# Patient Record
Sex: Female | Born: 1955 | Race: White | Hispanic: No | Smoking: Current every day smoker
Health system: Southern US, Community
[De-identification: ages and names within clinical notes are randomized; demographics above are authoritative.]

## PROBLEM LIST (undated history)

## (undated) DIAGNOSIS — F419 Anxiety disorder, unspecified: Secondary | ICD-10-CM

## (undated) DIAGNOSIS — Z86010 Personal history of colonic polyps: Secondary | ICD-10-CM

## (undated) HISTORY — DX: Personal history of colonic polyps: Z86.010

## (undated) HISTORY — PX: POLYPECTOMY: SHX149

## (undated) HISTORY — DX: Anxiety disorder, unspecified: F41.9

## (undated) HISTORY — PX: COLONOSCOPY: SHX174

---

## 2011-11-02 ENCOUNTER — Other Ambulatory Visit (HOSPITAL_COMMUNITY)
Admission: RE | Admit: 2011-11-02 | Discharge: 2011-11-02 | Disposition: A | Discharge status: Home / Self Care | Payer: BC Managed Care – PPO | Source: Ambulatory Visit | Attending: Family Medicine | Admitting: Family Medicine

## 2011-11-02 ENCOUNTER — Ambulatory Visit (INDEPENDENT_AMBULATORY_CARE_PROVIDER_SITE_OTHER): Payer: BC Managed Care – PPO | Admitting: Family Medicine

## 2011-11-02 ENCOUNTER — Encounter: Payer: Self-pay | Admitting: Family Medicine

## 2011-11-02 VITALS — BP 164/100 | HR 116 | Temp 98.3°F | Ht 65.75 in | Wt 198.0 lb

## 2011-11-02 DIAGNOSIS — Z1151 Encounter for screening for human papillomavirus (HPV): Secondary | ICD-10-CM | POA: Insufficient documentation

## 2011-11-02 DIAGNOSIS — Z1231 Encounter for screening mammogram for malignant neoplasm of breast: Secondary | ICD-10-CM

## 2011-11-02 DIAGNOSIS — Z1211 Encounter for screening for malignant neoplasm of colon: Secondary | ICD-10-CM

## 2011-11-02 DIAGNOSIS — Z01419 Encounter for gynecological examination (general) (routine) without abnormal findings: Secondary | ICD-10-CM | POA: Insufficient documentation

## 2011-11-02 DIAGNOSIS — Z0000 Encounter for general adult medical examination without abnormal findings: Secondary | ICD-10-CM

## 2011-11-02 DIAGNOSIS — F4322 Adjustment disorder with anxiety: Secondary | ICD-10-CM

## 2011-11-02 DIAGNOSIS — Z136 Encounter for screening for cardiovascular disorders: Secondary | ICD-10-CM

## 2011-11-02 DIAGNOSIS — R030 Elevated blood-pressure reading, without diagnosis of hypertension: Secondary | ICD-10-CM

## 2011-11-02 LAB — COMPREHENSIVE METABOLIC PANEL
ALT: 27 U/L (ref 0–35)
AST: 23 U/L (ref 0–37)
Alkaline Phosphatase: 95 U/L (ref 39–117)
BUN: 12 mg/dL (ref 6–23)
Calcium: 9.9 mg/dL (ref 8.4–10.5)
Chloride: 106 mEq/L (ref 96–112)
Creatinine, Ser: 0.8 mg/dL (ref 0.4–1.2)

## 2011-11-02 LAB — CBC WITH DIFFERENTIAL/PLATELET
Basophils Relative: 0.7 % (ref 0.0–3.0)
Eosinophils Relative: 0.5 % (ref 0.0–5.0)
Lymphocytes Relative: 15.1 % (ref 12.0–46.0)
Monocytes Absolute: 1.1 10*3/uL — ABNORMAL HIGH (ref 0.1–1.0)
Monocytes Relative: 6.5 % (ref 3.0–12.0)
Neutrophils Relative %: 77.2 % — ABNORMAL HIGH (ref 43.0–77.0)
Platelets: 322 10*3/uL (ref 150.0–400.0)
RBC: 4.76 Mil/uL (ref 3.87–5.11)
WBC: 16.3 10*3/uL — ABNORMAL HIGH (ref 4.5–10.5)

## 2011-11-02 LAB — LIPID PANEL
HDL: 68.7 mg/dL (ref >39.00)
Total CHOL/HDL Ratio: 3
Triglycerides: 196 mg/dL — ABNORMAL HIGH (ref 0.0–149.0)
VLDL: 39.2 mg/dL (ref 0.0–40.0)

## 2011-11-02 LAB — LDL CHOLESTEROL, DIRECT: Direct LDL: 136.4 mg/dL

## 2011-11-02 MED ORDER — ALPRAZOLAM 0.25 MG PO TABS: 0.2500 mg | ORAL_TABLET | Freq: Three times a day (TID) | ORAL | Status: AC | PRN

## 2011-11-02 MED ORDER — BUSPIRONE HCL 15 MG PO TABS: 7.5000 mg | ORAL_TABLET | Freq: Two times a day (BID) | ORAL | Status: DC

## 2011-11-02 NOTE — Patient Instructions (Addendum)
It was nice to meet you.  Please go to any pharmacy or [facility] and buy and OMRON blood pressure cuff. Please make sure it's not a wrist cuff. I want you to check it twice a day, write it down and call me after 1 week of readings.  Please stop by to see Shirlee Limerick on your way out to set up your mammogram and GI appointment (for colonoscopy).  Please come see me in one month.

## 2011-11-02 NOTE — Progress Notes (Signed)
Subjective:    Patient ID: Maureen Warren, female    DOB: 07-Oct-1955, 56 y.o.   MRN: [id number]  HPI  56 yo G2P2 here to establish care.  Has not been to a doctor in years.  Has not had a mammogram or colonoscopy that she can ever remember.  No h/o abnormal pap smears but it has been years.  Her sister who is 106 yo was just diagnosed with breast CA.  She has another sister that was diagnosed with breast CA in her 28s. She is unsure if either one was tested for BRCA or estrogen sensitivities.  Sister is now living with her and she is constantly anxious.  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  She is often tearful. Ms. Geeslin feels like she is constantly caring for others and has not had time for herself. No SI or HI. Does not feel depressed.  Elevated BP- she is very anxious about coming to the doctor. She states it is always elevated at doctor's offices. When she checked it at [facility] last week, was 132/80.  She denies any HA, blurred vision, CP or SOB.  She is a smoker. No known family h/o HTN.  Patient Active Problem List  Diagnosis  . Routine general medical examination at a health care facility  . Routine gynecological examination  . Elevated blood pressure (not hypertension)   Past Medical History  Diagnosis Date  . Anxiety    No past surgical history on file. History  Substance Use Topics  . Smoking status: Current Everyday Smoker  . Smokeless tobacco: Not on file  . Alcohol Use: Not on file   Family History  Problem Relation Age of Onset  . Cancer Sister 3    breast cancer   No Known Allergies No current outpatient prescriptions on file prior to visit.   The PMH, PSH, Social History, Family History, Medications, and allergies have been reviewed in [facility], and have been updated if relevant.   Review of Systems See HPI Patient reports no  vision/ hearing changes,anorexia, weight change, fever ,adenopathy, persistant / recurrent  hoarseness, swallowing issues, chest pain, edema,persistant / recurrent cough, hemoptysis, dyspnea(rest, exertional, paroxysmal nocturnal), gastrointestinal  bleeding (melena, rectal bleeding), abdominal pain, excessive heart burn, GU symptoms(dysuria, hematuria, pyuria, voiding/incontinence  Issues) syncope, focal weakness, severe memory loss, concerning skin lesions, depression, abnormal bruising/bleeding, major joint swelling, breast masses or abnormal vaginal bleeding.  =    Objective:   Physical Exam BP 164/100  Pulse 116  Temp 98.3 F (36.8 C)  Ht 5' 5.75" (1.67 m)  Wt 198 lb (89.812 kg)  BMI 32.20 kg/m2  General:  Obese,well-nourished,in no acute distress; alert,appropriate and cooperative throughout examination Head:  normocephalic and atraumatic.   Eyes:  vision grossly intact, pupils equal, pupils round, and pupils reactive to light.   Ears:  R ear normal and L ear normal.   Nose:  no external deformity.   Mouth:  good dentition.   Neck:  No deformities, masses, or tenderness noted. Breasts:  No mass, nodules, thickening, tenderness, bulging, retraction, inflamation, nipple discharge or skin changes noted.   Lungs:  Normal respiratory effort, chest expands symmetrically. Lungs are clear to auscultation, no crackles or wheezes. Heart:  Normal rate and regular rhythm. S1 and S2 normal without gallop, murmur, click, rub or other extra sounds. Abdomen:  Bowel sounds positive,abdomen soft and non-tender without masses, organomegaly or hernias noted. Rectal:  no external abnormalities.   Genitalia:  Pelvic  Exam:        External: normal female genitalia without lesions or masses        Vagina: normal without lesions or masses        Cervix: normal without lesions or masses        Adnexa: normal bimanual exam without masses or fullness        Uterus: normal by palpation        Pap smear: performed Msk:  No deformity or scoliosis noted of thoracic or lumbar spine.   Extremities:  No  clubbing, cyanosis, edema, or deformity noted with normal full range of motion of all joints.   Neurologic:  alert & oriented X3 and gait normal.   Skin:  Intact without suspicious lesions or rashes Cervical Nodes:  No lymphadenopathy noted Axillary Nodes:  No palpable lymphadenopathy Psych:  Cognition and judgment appear intact. Alert and cooperative with normal attention span and concentration. No apparent delusions, illusions, hallucinations     Assessment & Plan:   1. Routine general medical examination at a health care facility  Reviewed preventive care protocols, scheduled due services, and updated immunizations Discussed nutrition, exercise, diet, and healthy lifestyle.  Comprehensive metabolic panel, CBC with Differential, Cytology - PAP Ambulatory referral to Gastroenterology MM Digital Screening  2. Routine gynecological examination  Cytology - PAP  3. Screening for ischemic heart disease  Lipid Panel  4. Elevated blood pressure (not hypertension)  BP elevated today but she is quite anxious. Discussed options- I would prefer not to treat her if this is simply white coat HTN as we could cause severe hypotension.  She is asymptomatic.  She agreed to buy a home BP cuff and check readings at home (see pt instructions).  She will also follow up with me in 1 month.    5. Adjustment disorder with anxiety     Deteriorated.  Discussed addiction and sedation potential of xanax.  She is deferring psychotherapy at this time.  Start buspar 7.5 mg twice daily and as needed xanax until buspar has taken affect.  Follow up in one month. The patient indicates understanding of these issues and agrees with the plan.

## 2011-11-03 ENCOUNTER — Other Ambulatory Visit: Payer: Self-pay | Admitting: Family Medicine

## 2011-11-03 DIAGNOSIS — D72829 Elevated white blood cell count, unspecified: Secondary | ICD-10-CM

## 2011-11-06 ENCOUNTER — Other Ambulatory Visit: Payer: BC Managed Care – PPO

## 2011-11-08 ENCOUNTER — Other Ambulatory Visit (INDEPENDENT_AMBULATORY_CARE_PROVIDER_SITE_OTHER): Payer: BC Managed Care – PPO

## 2011-11-08 DIAGNOSIS — D72829 Elevated white blood cell count, unspecified: Secondary | ICD-10-CM

## 2011-11-08 LAB — CBC WITH DIFFERENTIAL/PLATELET
Eosinophils Relative: 1.2 % (ref 0.0–5.0)
HCT: 44.8 % (ref 36.0–46.0)
Hemoglobin: 15.4 g/dL — ABNORMAL HIGH (ref 12.0–15.0)
Lymphs Abs: 3.1 10*3/uL (ref 0.7–4.0)
Monocytes Relative: 4.8 % (ref 3.0–12.0)
Platelets: 371 10*3/uL (ref 150.0–400.0)
RBC: 4.6 Mil/uL (ref 3.87–5.11)
WBC: 13.7 10*3/uL — ABNORMAL HIGH (ref 4.5–10.5)

## 2011-11-09 ENCOUNTER — Encounter: Payer: Self-pay | Admitting: *Deleted

## 2011-11-09 LAB — PATHOLOGIST SMEAR REVIEW

## 2011-11-13 ENCOUNTER — Ambulatory Visit
Admission: RE | Admit: 2011-11-13 | Discharge: 2011-11-13 | Disposition: A | Discharge status: Home / Self Care | Payer: BC Managed Care – PPO | Source: Ambulatory Visit | Attending: Family Medicine | Admitting: Family Medicine

## 2011-11-13 DIAGNOSIS — Z1231 Encounter for screening mammogram for malignant neoplasm of breast: Secondary | ICD-10-CM

## 2011-11-15 ENCOUNTER — Other Ambulatory Visit: Payer: Self-pay | Admitting: Family Medicine

## 2011-11-15 DIAGNOSIS — R928 Other abnormal and inconclusive findings on diagnostic imaging of breast: Secondary | ICD-10-CM

## 2011-11-21 ENCOUNTER — Ambulatory Visit
Admission: RE | Admit: 2011-11-21 | Discharge: 2011-11-21 | Disposition: A | Discharge status: Home / Self Care | Payer: BC Managed Care – PPO | Source: Ambulatory Visit | Attending: Family Medicine | Admitting: Family Medicine

## 2011-11-21 DIAGNOSIS — R928 Other abnormal and inconclusive findings on diagnostic imaging of breast: Secondary | ICD-10-CM

## 2011-12-06 ENCOUNTER — Other Ambulatory Visit: Payer: Self-pay | Admitting: Family Medicine

## 2011-12-06 DIAGNOSIS — R928 Other abnormal and inconclusive findings on diagnostic imaging of breast: Secondary | ICD-10-CM

## 2011-12-07 ENCOUNTER — Ambulatory Visit: Payer: BC Managed Care – PPO | Admitting: Family Medicine

## 2011-12-13 ENCOUNTER — Ambulatory Visit (AMBULATORY_SURGERY_CENTER): Payer: BC Managed Care – PPO | Admitting: *Deleted

## 2011-12-13 ENCOUNTER — Encounter: Payer: Self-pay | Admitting: Internal Medicine

## 2011-12-13 VITALS — Ht 65.5 in | Wt 199.2 lb

## 2011-12-13 DIAGNOSIS — Z1211 Encounter for screening for malignant neoplasm of colon: Secondary | ICD-10-CM

## 2011-12-13 MED ORDER — NA SULFATE-K SULFATE-MG SULF 17.5-3.13-1.6 GM/177ML PO SOLN: ORAL | Status: DC

## 2011-12-20 ENCOUNTER — Encounter: Payer: Self-pay | Admitting: Family Medicine

## 2011-12-20 ENCOUNTER — Other Ambulatory Visit: Payer: Self-pay | Admitting: Family Medicine

## 2011-12-20 ENCOUNTER — Ambulatory Visit (INDEPENDENT_AMBULATORY_CARE_PROVIDER_SITE_OTHER): Payer: BC Managed Care – PPO | Admitting: Family Medicine

## 2011-12-20 VITALS — BP 138/84 | HR 88 | Temp 97.8°F | Wt 199.0 lb

## 2011-12-20 DIAGNOSIS — F4322 Adjustment disorder with anxiety: Secondary | ICD-10-CM

## 2011-12-20 DIAGNOSIS — R030 Elevated blood-pressure reading, without diagnosis of hypertension: Secondary | ICD-10-CM

## 2011-12-20 DIAGNOSIS — Z23 Encounter for immunization: Secondary | ICD-10-CM

## 2011-12-20 DIAGNOSIS — D72819 Decreased white blood cell count, unspecified: Secondary | ICD-10-CM

## 2011-12-20 DIAGNOSIS — D72829 Elevated white blood cell count, unspecified: Secondary | ICD-10-CM

## 2011-12-20 LAB — CBC WITH DIFFERENTIAL/PLATELET
Basophils Absolute: 0.2 10*3/uL — ABNORMAL HIGH (ref 0.0–0.1)
Basophils Relative: 1.3 % (ref 0.0–3.0)
Eosinophils Absolute: 0.1 10*3/uL (ref 0.0–0.7)
HCT: 46.3 % — ABNORMAL HIGH (ref 36.0–46.0)
Hemoglobin: 15.6 g/dL — ABNORMAL HIGH (ref 12.0–15.0)
Lymphocytes Relative: 16.6 % (ref 12.0–46.0)
Lymphs Abs: 2.5 10*3/uL (ref 0.7–4.0)
MCHC: 33.7 g/dL (ref 30.0–36.0)
MCV: 98.2 fl (ref 78.0–100.0)
Monocytes Absolute: 0.7 10*3/uL (ref 0.1–1.0)
Neutro Abs: 11.8 10*3/uL — ABNORMAL HIGH (ref 1.4–7.7)
RBC: 4.72 Mil/uL (ref 3.87–5.11)
RDW: 13.7 % (ref 11.5–14.6)

## 2011-12-20 MED ORDER — BUSPIRONE HCL 15 MG PO TABS: 15.0000 mg | ORAL_TABLET | Freq: Two times a day (BID) | ORAL | Status: DC

## 2011-12-20 NOTE — Progress Notes (Signed)
Subjective:    Patient ID: Maureen Warren, female    DOB: 1955-08-14, 56 y.o.   MRN: [id number]  HPI  56 yo G2P2 here for one month follow up.  Establish care last month.  Adjustment disorder-  Sister is now living with her and she is constantly anxious.  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  She is often tearful. Ms. Gritton feels like she is constantly caring for others and has not had time for herself. No SI or HI. Does not feel depressed. We started Buspar 7.5 mg twice daily last month. She has not noticed much improvement yet.  No side effects. Does use prn xanax which has helped.  Elevated BP-  Bp was elevated at last office visit.  She was nervous about her first appointment. Normotensive today.  She denies any HA, blurred vision, CP or SOB.  She is a smoker. No known family h/o HTN.  Patient Active Problem List  Diagnosis  . Routine general medical examination at a health care facility  . Routine gynecological examination  . Elevated blood pressure (not hypertension)  . Adjustment disorder with anxiety  . Abnormal mammogram   Past Medical History  Diagnosis Date  . Anxiety    Past Surgical History  Procedure Date  . Cesarean section 1989   History  Substance Use Topics  . Smoking status: Current Every Day Smoker -- 1.0 packs/day    Types: Cigarettes  . Smokeless tobacco: Never Used  . Alcohol Use: 3.6 oz/week    6 Cans of beer per week   Family History  Problem Relation Age of Onset  . Cancer Sister 28    breast cancer  . Colon cancer Neg Hx   . Stomach cancer Neg Hx    No Known Allergies Current Outpatient Prescriptions on File Prior to Visit  Medication Sig Dispense Refill  . ALPRAZolam (XANAX) 0.25 MG tablet Take 0.25 mg by mouth 3 (three) times daily as needed.      . busPIRone (BUSPAR) 15 MG tablet Take 0.5 tablets (7.5 mg total) by mouth 2 (two) times daily.  60 tablet  1  . Na Sulfate-K Sulfate-Mg Sulf (SUPREP  BOWEL PREP) SOLN suprep as directed.  No substitutions  354 mL  0   The PMH, PSH, Social History, Family History, Medications, and allergies have been reviewed in [facility], and have been updated if relevant.   Review of Systems See HPI Patient reports no  vision/ hearing changes,anorexia, weight change, fever ,adenopathy, persistant / recurrent hoarseness, swallowing issues, chest pain, edema,persistant / recurrent cough, hemoptysis, dyspnea(rest, exertional, paroxysmal nocturnal), gastrointestinal  bleeding (melena, rectal bleeding), abdominal pain, excessive heart burn, GU symptoms(dysuria, hematuria, pyuria, voiding/incontinence  Issues) syncope, focal weakness, severe memory loss, concerning skin lesions, depression, abnormal bruising/bleeding, major joint swelling, breast masses or abnormal vaginal bleeding.  =    Objective:   Physical Exam BP 138/84  Pulse 88  Temp 97.8 F (36.6 C)  Wt 199 lb (90.266 kg)  General:  Obese,well-nourished,in no acute distress; alert,appropriate and cooperative throughout examination Head:  normocephalic and atraumatic.   Eyes:  vision grossly intact, pupils equal, pupils round, and pupils reactive to light.   Ears:  R ear normal and L ear normal.   Nose:  no external deformity.   Lungs:  Normal respiratory effort, chest expands symmetrically. Lungs are clear to auscultation, no crackles or wheezes. Heart:  Normal rate and regular rhythm. S1 and S2 normal without gallop, murmur,  click, rub or other extra sounds. Abdomen:  Bowel sounds positive,abdomen soft and non-tender without masses, organomegaly or hernias noted. Msk:  No deformity or scoliosis noted of thoracic or lumbar spine.   Extremities:  No clubbing, cyanosis, edema, or deformity noted with normal full range of motion of all joints.   Neurologic:  alert & oriented X3 and gait normal.   Skin:  Intact without suspicious lesions or rashes Psych:  Cognition and judgment appear intact. Alert and  cooperative with normal attention span and concentration. No apparent delusions, illusions, hallucinations     Assessment & Plan:    1. Elevated blood pressure (not hypertension)  Normotensive today. Continue to monitor on home BP cuff. Reassurance provided. Call or return to clinic prn if these symptoms worsen or fail to improve as anticipated.    2. Adjustment disorder with anxiety  Increase Buspar to 15 mg twice daily. Pt to call in one month with an update.

## 2011-12-20 NOTE — Patient Instructions (Addendum)
I am so pleased your blood pressure! We are increasing your buspar to 15 mg (whole tablet) twice daily.  Call me in one month and let me know how you're doing.  I will be thinking about you next Wednesday.

## 2011-12-21 ENCOUNTER — Telehealth: Payer: Self-pay | Admitting: Oncology

## 2011-12-21 NOTE — Telephone Encounter (Signed)
S/W pt in re NP appt 10/01 @ 10:30 w/ Dr. Clelia Croft.  Referring Dr. Jovita Gamma Dx-Leukocytosis NP packet mailed.

## 2011-12-22 ENCOUNTER — Telehealth: Payer: Self-pay | Admitting: Oncology

## 2011-12-22 NOTE — Telephone Encounter (Signed)
C/D on 9/20 for appt 10/01

## 2011-12-27 ENCOUNTER — Encounter: Payer: Self-pay | Admitting: Internal Medicine

## 2011-12-27 ENCOUNTER — Ambulatory Visit (AMBULATORY_SURGERY_CENTER): Payer: BC Managed Care – PPO | Admitting: Internal Medicine

## 2011-12-27 VITALS — BP 130/90 | HR 105 | Temp 99.1°F | Resp 16 | Ht 65.0 in | Wt 199.0 lb

## 2011-12-27 DIAGNOSIS — D126 Benign neoplasm of colon, unspecified: Secondary | ICD-10-CM

## 2011-12-27 DIAGNOSIS — Z1211 Encounter for screening for malignant neoplasm of colon: Secondary | ICD-10-CM

## 2011-12-27 DIAGNOSIS — K648 Other hemorrhoids: Secondary | ICD-10-CM

## 2011-12-27 DIAGNOSIS — K5730 Diverticulosis of large intestine without perforation or abscess without bleeding: Secondary | ICD-10-CM

## 2011-12-27 MED ORDER — SODIUM CHLORIDE 0.9 % IV SOLN: 500.0000 mL | INTRAVENOUS | Status: DC

## 2011-12-27 NOTE — Op Note (Signed)
[facility] [address] [place], [address]   COLONOSCOPY PROCEDURE REPORT  PATIENT: Manasi, Dishon  MR#: [id number] BIRTHDATE: [birth year] , 56  yrs. old GENDER: Female ENDOSCOPIST: Iva Boop, MD, [facility] REFERRED XB:JYNWG Aron, M.D. PROCEDURE DATE:  12/27/2011 PROCEDURE:   Colonoscopy with snare polypectomy ASA CLASS:   Class II INDICATIONS:average risk screening. MEDICATIONS: Propofol (Diprivan) 480 mg IV, MAC sedation, administered by CRNA, and These medications were titrated to patient response per physician's verbal order  DESCRIPTION OF PROCEDURE:   After the risks benefits and alternatives of the procedure were thoroughly explained, informed consent was obtained.  A digital rectal exam revealed no abnormalities of the rectum.   The LB [id number]  endoscope was introduced through the anus and advanced to the cecum, which was identified by both the appendix and ileocecal valve. No adverse events experienced.   The quality of the prep was Suprep excellent The instrument was then slowly withdrawn as the colon was fully examined.      COLON FINDINGS: Multiple sessile polyps (12) measuring 5-10 mm in size were found at the cecum, in the ascending colon, transverse colon, at the splenic flexure, in the descending colon, sigmoid colon, and rectum.  A polypectomy was performed with a cold snare. The resection was complete and the polyp tissue was completely retrieved.   There was severe diverticulosis noted in the sigmoid colon with associated angulation and mild luminal narrowing. The sigmoid was somewhat fixed also.  Small internal hemorrhoids were found.   The colon mucosa was otherwise normal.  Retroflexed views revealed internal hemorrhoids. The time to cecum=7 minutes 40 seconds.  Withdrawal time=22 minutes 45 seconds.  The scope was withdrawn and the procedure completed. COMPLICATIONS: There were no complications.  ENDOSCOPIC  IMPRESSION: 1.   Multiple sessile polyps (12) measuring 5-10 mm in size were found at the cecum, in the ascending colon, transverse colon, at the splenic flexure, in the descending colon, sigmoid colon, and rectum; polypectomy was performed with a cold snare 2.   There was severe diverticulosis noted in the sigmoid colon with mild luminal narrowing and it was also somewhat fixed, making scope passage moderately difficult beyond here. 3.   Small internal hemorrhoids 4.   The colon mucosa was otherwise normal with excellent prep  RECOMMENDATIONS: Timing of repeat colonoscopy will be determined by pathology findings.   eSigned:  Iva Boop, MD, [facility] 12/27/2011 9:57 AM   cc: Enos Fling MD and The Patient   PATIENT NAME:  Amulya, Quintin MR#: [id number]

## 2011-12-27 NOTE — Patient Instructions (Addendum)
Twelve (12) polyps were removed today. They appear benign. i will let you know the pathology results and recommendations.  You also have diverticulosis and hemorrhoids.  Thank you for choosing me and  [facility].  Iva Boop, MD, FACG  YOU HAD AN ENDOSCOPIC PROCEDURE TODAY AT THE [facility]: Refer to the procedure report that was given to you for any specific questions about what was found during the examination.  If the procedure report does not answer your questions, please call your gastroenterologist to clarify.  If you requested that your care partner not be given the details of your procedure findings, then the procedure report has been included in a sealed envelope for you to review at your convenience later.  YOU SHOULD EXPECT: Some feelings of bloating in the abdomen. Passage of more gas than usual.  Walking can help get rid of the air that was put into your GI tract during the procedure and reduce the bloating. If you had a lower endoscopy (such as a colonoscopy or flexible sigmoidoscopy) you may notice spotting of blood in your stool or on the toilet paper. If you underwent a bowel prep for your procedure, then you may not have a normal bowel movement for a few days.  DIET: Your first meal following the procedure should be a light meal and then it is ok to progress to your normal diet.  A half-sandwich or bowl of soup is an example of a good first meal.  Heavy or fried foods are harder to digest and may make you feel nauseous or bloated.  Likewise meals heavy in dairy and vegetables can cause extra gas to form and this can also increase the bloating.  Drink plenty of fluids but you should avoid alcoholic beverages for 24 hours.  ACTIVITY: Your care partner should take you home directly after the procedure.  You should plan to take it easy, moving slowly for the rest of the day.  You can resume normal activity the day after the procedure however you should NOT  DRIVE or use heavy machinery for 24 hours (because of the sedation medicines used during the test).    SYMPTOMS TO REPORT IMMEDIATELY: A gastroenterologist can be reached at any hour.  During normal business hours, 8:30 AM to 5:00 PM Monday through Friday, call [phone].  After hours and on weekends, please call the GI answering service at [phone] who will take a message and have the physician on call contact you.   Following lower endoscopy (colonoscopy or flexible sigmoidoscopy):  Excessive amounts of blood in the stool  Significant tenderness or worsening of abdominal pains  Swelling of the abdomen that is new, acute  Fever of 100F or higher  FOLLOW UP: If any biopsies were taken you will be contacted by phone or by letter within the next 1-3 weeks.  Call your gastroenterologist if you have not heard about the biopsies in 3 weeks.  Our staff will call the home number listed on your records the next business day following your procedure to check on you and address any questions or concerns that you may have at that time regarding the information given to you following your procedure. This is a courtesy call and so if there is no answer at the home number and we have not heard from you through the emergency physician on call, we will assume that you have returned to your regular daily activities without incident.  SIGNATURES/CONFIDENTIALITY: You and/or your care partner  have signed paperwork which will be entered into your electronic medical record.  These signatures attest to the fact that that the information above on your After Visit Summary has been reviewed and is understood.  Full responsibility of the confidentiality of this discharge information lies with you and/or your care-partner.

## 2011-12-27 NOTE — Progress Notes (Signed)
Patient did not experience any of the following events: a burn prior to discharge; a fall within the facility; wrong site/side/patient/procedure/implant event; or a hospital transfer or hospital admission upon discharge from the facility. (G8907) Patient did not have preoperative order for IV antibiotic SSI prophylaxis. (G8918)  

## 2011-12-28 ENCOUNTER — Other Ambulatory Visit: Payer: Self-pay | Admitting: Oncology

## 2011-12-28 ENCOUNTER — Telehealth: Payer: Self-pay | Admitting: *Deleted

## 2011-12-28 DIAGNOSIS — D72829 Elevated white blood cell count, unspecified: Secondary | ICD-10-CM

## 2011-12-28 NOTE — Telephone Encounter (Signed)
No answer, left message to call if questions or concerns. 

## 2012-01-02 ENCOUNTER — Telehealth: Payer: Self-pay | Admitting: Oncology

## 2012-01-02 ENCOUNTER — Encounter: Payer: Self-pay | Admitting: Internal Medicine

## 2012-01-02 ENCOUNTER — Encounter: Payer: Self-pay | Admitting: Oncology

## 2012-01-02 ENCOUNTER — Ambulatory Visit (HOSPITAL_COMMUNITY)
Admission: RE | Admit: 2012-01-02 | Discharge: 2012-01-02 | Disposition: A | Discharge status: Home / Self Care | Payer: BC Managed Care – PPO | Source: Ambulatory Visit | Attending: Oncology | Admitting: Oncology

## 2012-01-02 ENCOUNTER — Ambulatory Visit: Payer: BC Managed Care – PPO

## 2012-01-02 ENCOUNTER — Ambulatory Visit (HOSPITAL_BASED_OUTPATIENT_CLINIC_OR_DEPARTMENT_OTHER): Payer: BC Managed Care – PPO | Admitting: Oncology

## 2012-01-02 ENCOUNTER — Other Ambulatory Visit (HOSPITAL_BASED_OUTPATIENT_CLINIC_OR_DEPARTMENT_OTHER): Payer: BC Managed Care – PPO | Admitting: Lab

## 2012-01-02 VITALS — BP 159/85 | HR 111 | Temp 98.6°F | Resp 20 | Ht 65.0 in | Wt 197.4 lb

## 2012-01-02 DIAGNOSIS — Z860100 Personal history of colon polyps, unspecified: Secondary | ICD-10-CM

## 2012-01-02 DIAGNOSIS — D72829 Elevated white blood cell count, unspecified: Secondary | ICD-10-CM

## 2012-01-02 DIAGNOSIS — F17200 Nicotine dependence, unspecified, uncomplicated: Secondary | ICD-10-CM

## 2012-01-02 DIAGNOSIS — F411 Generalized anxiety disorder: Secondary | ICD-10-CM

## 2012-01-02 DIAGNOSIS — R059 Cough, unspecified: Secondary | ICD-10-CM | POA: Insufficient documentation

## 2012-01-02 DIAGNOSIS — R05 Cough: Secondary | ICD-10-CM | POA: Insufficient documentation

## 2012-01-02 HISTORY — DX: Personal history of colon polyps, unspecified: Z86.0100

## 2012-01-02 HISTORY — DX: Personal history of colonic polyps: Z86.010

## 2012-01-02 LAB — CBC WITH DIFFERENTIAL/PLATELET
BASO%: 0.6 % (ref 0.0–2.0)
Eosinophils Absolute: 0.1 10*3/uL (ref 0.0–0.5)
HCT: 46.9 % — ABNORMAL HIGH (ref 34.8–46.6)
LYMPH%: 20.7 % (ref 14.0–49.7)
MCHC: 34.2 g/dL (ref 31.5–36.0)
MCV: 98.2 fL (ref 79.5–101.0)
MONO#: 1 10*3/uL — ABNORMAL HIGH (ref 0.1–0.9)
MONO%: 6.2 % (ref 0.0–14.0)
NEUT%: 71.9 % (ref 38.4–76.8)
Platelets: 343 10*3/uL (ref 145–400)
RBC: 4.77 10*6/uL (ref 3.70–5.45)
WBC: 15.5 10*3/uL — ABNORMAL HIGH (ref 3.9–10.3)

## 2012-01-02 LAB — COMPREHENSIVE METABOLIC PANEL (CC13)
Alkaline Phosphatase: 101 U/L (ref 40–150)
CO2: 22 mEq/L (ref 22–29)
Creatinine: 0.7 mg/dL (ref 0.6–1.1)
Glucose: 101 mg/dl — ABNORMAL HIGH (ref 70–99)
Sodium: 144 mEq/L (ref 136–145)
Total Bilirubin: 0.5 mg/dL (ref 0.20–1.20)

## 2012-01-02 LAB — CHCC SMEAR

## 2012-01-02 NOTE — Progress Notes (Signed)
CC:   Ruthe Mannan, M.D.  REASON FOR CONSULTATION:  Leukocytosis.  HISTORY OF PRESENT ILLNESS:  Ms. Ramella is a pleasant 56 year old woman, currently of [place], [place].  She is generally in relative good health.  However, she has not been seeking medical attention for the longest time.  Very anxious person, heavy smoker, who started establishing care with Dr. Dayton Martes for management of hypertension. However, she had ambulatory blood pressure monitoring that really did not require any treatment.  It was probably related to her anxiety.  The patient has been doing the age-appropriate routine cancer screening, including colonoscopy and a mammogram which were both unremarkable. 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  She is prescribed antianxiety medication and has been followed as mentioned by Dr. Dayton Martes for that.  Her more recent __________ on 09/18 she had a white cell count of 15.3, her hemoglobin was 15.6, platelet count 348.  She had normal differential at that time.  Historically she had a high white cell count on August 1st that was 16,000, dropped down to 13,000 on August 7th.  She did not report any recent infections, recent hospitalization, bronchitis, pneumonias, but as mentioned, she is a heavy smoker, as well as she has increased anxiety.  REVIEW OF SYSTEMS:  She did not report any headaches, blurry vision, double vision.  Did not report any motor or sensory neuropathy.  Did not report any alteration in mental status.  Did not report any psychiatric issues or depression.  Did not report any fever, chills, sweats.  Did not report any cough, hemoptysis, hematemesis.  No nausea.  Did not report any vomiting.  No abdominal pain.  Did not report any abdominal discomfort.  No diarrhea or constipation.  Did not report any musculoskeletal complaints.  Did not report any arthralgias or  myalgias. Rest of review of systems is unremarkable.  PAST MEDICAL HISTORY:  Significant for anxiety and history of as mentioned hypertension, does not require treatment.  She does not have any history of diabetes or heart disease.  She is status post cesarean section.  MEDICATIONS:  She is on Xanax, as well as BuSpar.  ALLERGIES:  None.  SOCIAL HISTORY:  She is married.  She has 2 children.  She smoked a pack per day for many years and sometimes smoked more than that when she is more anxious.  Drinks about 1 beer a day.  FAMILY HISTORY:  Her father died of car accident.  Mother had heart disease.  She had 1 sister who had breast cancer.  PHYSICAL EXAMINATION:  General:  Alert, awake, very pleasant, but anxious woman, appeared in no active distress.  Vital Signs:  Her blood pressure is 159/85, pulse is 111, respirations 20, temperature is 98. ECOG performance status is 0.  HEENT:  Head is normocephalic, atraumatic.  Pupils equal, round, reactive to light.  Oral mucosa moist and pink.  Neck:  Supple.  No lymphadenopathy.  Heart:  Regular rate and rhythm, S1 and S2.  Lungs:  Clear to auscultation.  No rhonchi, wheezes, or dullness to percussion.  Abdomen:  Soft, nontender.  No hepatosplenomegaly.  Extremities:  No clubbing, cyanosis, or edema. Neurologically:  Intact motor, sensory, and deep tendon reflexes.  LABORATORY DATA:  Showed a hemoglobin of 16, white cell count of 15.5, platelet count 343.  Her differential was completely normal.  Peripheral smear was personally reviewed today and showed mild leukocytosis and a  left shift.  Could not appreciate any dysplastic-looking cells.  ASSESSMENT AND PLAN:  A 56 year old woman with the following issues: 1. Leukocytosis.  Differential diagnosis discussed today with Ms.     Dooms.  A secondary cause of leukocytosis or reactive     leukocytosis is the most likely etiology.  I think in her     situation, I think the constant anxiety,  smoking, recent     interventions such as mammography and colonoscopy have created     possibly increased cortisol levels and caused the leukocytosis.  I     do not really see any evidence of any infection or colitis or other     chronic inflammatory conditions at this time.  Primary causes are     always on the differential that include myeloproliferative disorder     such as CML or polycythemia vera.  I think that these are unlikely     at this point.  I think her presentation supports more secondary     causes.  Underlying or occult malignancy is always a possibility,     especially in the setting of a heavy smoker.  She is up to speed on     her breast cancer and colon cancer screening.  She has not had a     chest x-ray and for that reason, I will ask her to do that at this     time.  For the time being, I think close observation would be     warranted.  I would like to repeat her CBC in about 4 months.  I     have advised her and counseled her about smoking cessation and she     will consider it for the time being.  All her questions were     answered today. 2. Anxiety.  She follows up with Dr. Dayton Martes regarding that.    ______________________________ Benjiman Core, M.D. FNS/MEDQ  D:  01/02/2012  T:  01/02/2012  Job:  [id number]

## 2012-01-02 NOTE — Telephone Encounter (Signed)
Gave pt appt for February 2014 lab and MD, patient sent to cxr today @ [facility]

## 2012-01-02 NOTE — Progress Notes (Signed)
Note dictated

## 2012-01-02 NOTE — Progress Notes (Signed)
Checked in new pt with no financial concerns. °

## 2012-01-02 NOTE — Progress Notes (Signed)
Quick Note:  12 polyps max 10 mm - tubular adenomas and sessile serrated polyps Repeat colon 01/2013 ______

## 2012-05-03 ENCOUNTER — Ambulatory Visit: Payer: BC Managed Care – PPO | Admitting: Oncology

## 2012-05-14 ENCOUNTER — Other Ambulatory Visit (HOSPITAL_BASED_OUTPATIENT_CLINIC_OR_DEPARTMENT_OTHER): Payer: BC Managed Care – PPO | Admitting: Lab

## 2012-05-14 ENCOUNTER — Telehealth: Payer: Self-pay | Admitting: Oncology

## 2012-05-14 ENCOUNTER — Ambulatory Visit (HOSPITAL_BASED_OUTPATIENT_CLINIC_OR_DEPARTMENT_OTHER): Payer: BC Managed Care – PPO | Admitting: Oncology

## 2012-05-14 VITALS — BP 151/89 | HR 109 | Temp 97.4°F | Resp 20 | Ht 65.0 in | Wt 203.0 lb

## 2012-05-14 DIAGNOSIS — D72829 Elevated white blood cell count, unspecified: Secondary | ICD-10-CM

## 2012-05-14 DIAGNOSIS — F17200 Nicotine dependence, unspecified, uncomplicated: Secondary | ICD-10-CM

## 2012-05-14 DIAGNOSIS — F411 Generalized anxiety disorder: Secondary | ICD-10-CM

## 2012-05-14 LAB — CBC WITH DIFFERENTIAL/PLATELET
Basophils Absolute: 0.1 10*3/uL (ref 0.0–0.1)
EOS%: 1.6 % (ref 0.0–7.0)
HCT: 47.9 % — ABNORMAL HIGH (ref 34.8–46.6)
HGB: 16.4 g/dL — ABNORMAL HIGH (ref 11.6–15.9)
MCH: 33.1 pg (ref 25.1–34.0)
MCV: 96.8 fL (ref 79.5–101.0)
NEUT%: 64.7 % (ref 38.4–76.8)
Platelets: 324 10*3/uL (ref 145–400)
lymph#: 3.4 10*3/uL — ABNORMAL HIGH (ref 0.9–3.3)

## 2012-05-14 NOTE — Progress Notes (Signed)
Hematology and Oncology Follow Up Visit  Maureen Warren [id number] [birth year] 57 y.o. 05/14/2012 10:10 AM   Principle Diagnosis: 57 year old with leukocytosis, likely reactive diagnosed in 01/2012.   Current therapy: Observation and follow up.   Interim History: Ms. Maureen Warren presents today for a follow up visit. She is a pleasant women with the above history presents for a follow up.  She is not reporting any new problems at this time. She did not report any recent infections, recent hospitalization, bronchitis, pneumonias, but as mentioned, she is a heavy smoker, as well as she has increased anxiety. No new constitutional symptoms.    Medications: I have reviewed the patient's current medications. Current outpatient prescriptions:ALPRAZolam (XANAX) 0.25 MG tablet, Take 0.25 mg by mouth 3 (three) times daily as needed., Disp: , Rfl: ;  busPIRone (BUSPAR) 15 MG tablet, Take 1 tablet (15 mg total) by mouth 2 (two) times daily., Disp: 60 tablet, Rfl: 1  Allergies: No Known Allergies  Past Medical History, Surgical history, Social history, and Family History were reviewed and updated.  Review of Systems: Constitutional:  Negative for fever, chills, night sweats, anorexia, weight loss, pain. Cardiovascular: no chest pain or dyspnea on exertion Respiratory: negative Neurological: negative Dermatological: negative ENT: negative Skin: Negative. Gastrointestinal: negative Genito-Urinary: negative Hematological and Lymphatic: negative Breast: negative Musculoskeletal: negative Remaining ROS negative. Physical Exam: Blood pressure 151/89, pulse 109, temperature 97.4 F (36.3 C), temperature source Oral, resp. rate 20, height 5\' 5"  (1.651 m), weight 203 lb (92.08 kg). ECOG:  General appearance: alert Head: Normocephalic, without obvious abnormality, atraumatic Neck: no adenopathy, no carotid bruit, no JVD, supple, symmetrical, trachea midline and thyroid not enlarged, symmetric, no  tenderness/mass/nodules Lymph nodes: Cervical, supraclavicular, and axillary nodes normal. Heart:regular rate and rhythm, S1, S2 normal, no murmur, click, rub or gallop Lung:chest clear, no wheezing, rales, normal symmetric air entry Abdomin: soft, non-tender, without masses or organomegaly EXT:no erythema, induration, or nodules   Lab Results: Lab Results  Component Value Date   WBC 13.2* 05/14/2012   HGB 16.4* 05/14/2012   HCT 47.9* 05/14/2012   MCV 96.8 05/14/2012   PLT 324 05/14/2012     Chemistry      Component Value Date/Time   NA 144 01/02/2012 1104   NA 140 11/02/2011 1425   K 4.1 01/02/2012 1104   K 4.2 11/02/2011 1425   CL 110* 01/02/2012 1104   CL 106 11/02/2011 1425   CO2 22 01/02/2012 1104   CO2 23 11/02/2011 1425   BUN 7.0 01/02/2012 1104   BUN 12 11/02/2011 1425   CREATININE 0.7 01/02/2012 1104   CREATININE 0.8 11/02/2011 1425      Component Value Date/Time   CALCIUM 10.2 01/02/2012 1104   CALCIUM 9.9 11/02/2011 1425   ALKPHOS 101 01/02/2012 1104   ALKPHOS 95 11/02/2011 1425   AST 22 01/02/2012 1104   AST 23 11/02/2011 1425   ALT 35 01/02/2012 1104   ALT 27 11/02/2011 1425   BILITOT 0.50 01/02/2012 1104   BILITOT 0.3 11/02/2011 1425      Impression and Plan:  57 year old woman with leukocytosis. Differential diagnosis discussed today with Ms. [name]. A secondary cause of leukocytosis or reactive leukocytosis is the most likely etiology. Her WBC are down today compared to last visit which goes against a MPD.  For now, we will continue to observation and repeat count in 6 months. If stable, then she can follow PRN.     [facility], MD 2/11/201410:10 AM

## 2012-06-13 ENCOUNTER — Encounter: Payer: Self-pay | Admitting: Oncology

## 2012-06-13 NOTE — Progress Notes (Signed)
Patient called about setting arrangements for bill. I asked if she had EPP with [place], I looked and no. She said her hubby makes $180k per year. I advised her they would overqualified and she needs to call and set up pmts with billing the ph# on her bill.

## 2012-06-18 ENCOUNTER — Other Ambulatory Visit: Payer: Self-pay | Admitting: Family Medicine

## 2012-06-18 DIAGNOSIS — N63 Unspecified lump in unspecified breast: Secondary | ICD-10-CM

## 2012-07-03 ENCOUNTER — Ambulatory Visit
Admission: RE | Admit: 2012-07-03 | Discharge: 2012-07-03 | Disposition: A | Discharge status: Home / Self Care | Payer: BC Managed Care – PPO | Source: Ambulatory Visit | Attending: Family Medicine | Admitting: Family Medicine

## 2012-07-03 DIAGNOSIS — N63 Unspecified lump in unspecified breast: Secondary | ICD-10-CM

## 2012-10-24 ENCOUNTER — Other Ambulatory Visit: Payer: Self-pay | Admitting: Family Medicine

## 2012-10-24 DIAGNOSIS — N6320 Unspecified lump in the left breast, unspecified quadrant: Secondary | ICD-10-CM

## 2012-11-12 ENCOUNTER — Other Ambulatory Visit (HOSPITAL_BASED_OUTPATIENT_CLINIC_OR_DEPARTMENT_OTHER): Payer: BC Managed Care – PPO | Admitting: Lab

## 2012-11-12 ENCOUNTER — Other Ambulatory Visit: Payer: Self-pay

## 2012-11-12 ENCOUNTER — Ambulatory Visit (HOSPITAL_BASED_OUTPATIENT_CLINIC_OR_DEPARTMENT_OTHER): Payer: BC Managed Care – PPO | Admitting: Oncology

## 2012-11-12 ENCOUNTER — Other Ambulatory Visit: Payer: Self-pay | Admitting: Family Medicine

## 2012-11-12 VITALS — BP 151/78 | HR 78 | Temp 98.0°F | Resp 18 | Ht 65.0 in | Wt 204.9 lb

## 2012-11-12 DIAGNOSIS — N6320 Unspecified lump in the left breast, unspecified quadrant: Secondary | ICD-10-CM

## 2012-11-12 DIAGNOSIS — D72829 Elevated white blood cell count, unspecified: Secondary | ICD-10-CM

## 2012-11-12 LAB — CBC WITH DIFFERENTIAL/PLATELET
Eosinophils Absolute: 0.3 10*3/uL (ref 0.0–0.5)
HCT: 45.9 % (ref 34.8–46.6)
LYMPH%: 26.3 % (ref 14.0–49.7)
MCHC: 34.1 g/dL (ref 31.5–36.0)
MONO#: 0.8 10*3/uL (ref 0.1–0.9)
NEUT#: 10.2 10*3/uL — ABNORMAL HIGH (ref 1.5–6.5)
NEUT%: 66.3 % (ref 38.4–76.8)
Platelets: 376 10*3/uL (ref 145–400)
WBC: 15.4 10*3/uL — ABNORMAL HIGH (ref 3.9–10.3)

## 2012-11-12 LAB — COMPREHENSIVE METABOLIC PANEL (CC13)
BUN: 9.7 mg/dL (ref 7.0–26.0)
CO2: 22 mEq/L (ref 22–29)
Creatinine: 0.8 mg/dL (ref 0.6–1.1)
Glucose: 121 mg/dl (ref 70–140)
Total Bilirubin: 0.36 mg/dL (ref 0.20–1.20)

## 2012-11-12 NOTE — Progress Notes (Signed)
Hematology and Oncology Follow Up Visit  Maureen Warren [id number] Aug 18, 1955 57 y.o. 11/12/2012 10:25 AM   Principle Diagnosis: 57 year old with leukocytosis, likely due to reactive nature and smoking. This was diagnosed in 01/2012.   Current therapy: Observation and follow up.   Interim History: Maureen Warren presents today for a follow up visit. She is a pleasant women with the above history presents for a follow up.  She is not reporting any new problems at this time. She did not report any recent infections, recent hospitalization, bronchitis, pneumonias, but as mentioned, she is a heavy smoker, as well as she has increased anxiety. No new constitutional symptoms.  She reports no new symptoms.    Medications: I have reviewed the patient's current medications. No current outpatient prescriptions on file.  Allergies: No Known Allergies  Past Medical History, Surgical history, Social history, and Family History were reviewed and updated.  Review of Systems: Constitutional:  Negative for fever, chills, night sweats, anorexia, weight loss, pain. Cardiovascular: no chest pain or dyspnea on exertion Respiratory: negative Neurological: negative Dermatological: negative ENT: negative Skin: Negative. Gastrointestinal: negative Genito-Urinary: negative Hematological and Lymphatic: negative Breast: negative Musculoskeletal: negative Remaining ROS negative. Physical Exam: Blood pressure 151/78, pulse 78, temperature 98 F (36.7 C), temperature source Oral, resp. rate 18, height 5\' 5"  (1.651 m), weight 204 lb 14.4 oz (92.942 kg). ECOG: 0 General appearance: alert Head: Normocephalic, without obvious abnormality, atraumatic Neck: no adenopathy, no carotid bruit, no JVD, supple, symmetrical, trachea midline and thyroid not enlarged, symmetric, no tenderness/mass/nodules Lymph nodes: Cervical, supraclavicular, and axillary nodes normal. Heart:regular rate and rhythm, S1, S2 normal, no  murmur, click, rub or gallop Lung:chest clear, no wheezing, rales, normal symmetric air entry Abdomin: soft, non-tender, without masses or organomegaly EXT:no erythema, induration, or nodules   Lab Results: Lab Results  Component Value Date   WBC 15.4* 11/12/2012   HGB 15.7 11/12/2012   HCT 45.9 11/12/2012   MCV 96.9 11/12/2012   PLT 376 11/12/2012     Chemistry      Component Value Date/Time   NA 144 01/02/2012 1104   NA 140 11/02/2011 1425   K 4.1 01/02/2012 1104   K 4.2 11/02/2011 1425   CL 110* 01/02/2012 1104   CL 106 11/02/2011 1425   CO2 22 01/02/2012 1104   CO2 23 11/02/2011 1425   BUN 7.0 01/02/2012 1104   BUN 12 11/02/2011 1425   CREATININE 0.7 01/02/2012 1104   CREATININE 0.8 11/02/2011 1425      Component Value Date/Time   CALCIUM 10.2 01/02/2012 1104   CALCIUM 9.9 11/02/2011 1425   ALKPHOS 101 01/02/2012 1104   ALKPHOS 95 11/02/2011 1425   AST 22 01/02/2012 1104   AST 23 11/02/2011 1425   ALT 35 01/02/2012 1104   ALT 27 11/02/2011 1425   BILITOT 0.50 01/02/2012 1104   BILITOT 0.3 11/02/2011 1425      Impression and Plan:  57 year old woman with leukocytosis. Differential diagnosis discussed today with Maureen Warren. A secondary cause of leukocytosis or reactive leukocytosis is the most likely etiology. Her WBC is stable compared to last visits (over the last year) which goes against a MPD.  I see no need for further work up and we will be happy to see her as needed.     [facility], MD 8/12/201410:25 AM

## 2012-11-13 ENCOUNTER — Ambulatory Visit
Admission: RE | Admit: 2012-11-13 | Discharge: 2012-11-13 | Disposition: A | Discharge status: Home / Self Care | Payer: BC Managed Care – PPO | Source: Ambulatory Visit | Attending: Family Medicine | Admitting: Family Medicine

## 2012-11-13 DIAGNOSIS — N6320 Unspecified lump in the left breast, unspecified quadrant: Secondary | ICD-10-CM

## 2012-12-24 ENCOUNTER — Encounter: Payer: Self-pay | Admitting: Family Medicine

## 2012-12-24 ENCOUNTER — Ambulatory Visit (INDEPENDENT_AMBULATORY_CARE_PROVIDER_SITE_OTHER): Payer: BC Managed Care – PPO | Admitting: Family Medicine

## 2012-12-24 VITALS — BP 112/70 | HR 88 | Temp 97.8°F | Ht 65.75 in | Wt 208.8 lb

## 2012-12-24 DIAGNOSIS — D72829 Elevated white blood cell count, unspecified: Secondary | ICD-10-CM

## 2012-12-24 DIAGNOSIS — Z23 Encounter for immunization: Secondary | ICD-10-CM

## 2012-12-24 DIAGNOSIS — R7989 Other specified abnormal findings of blood chemistry: Secondary | ICD-10-CM

## 2012-12-24 DIAGNOSIS — Z0000 Encounter for general adult medical examination without abnormal findings: Secondary | ICD-10-CM

## 2012-12-24 DIAGNOSIS — Z136 Encounter for screening for cardiovascular disorders: Secondary | ICD-10-CM

## 2012-12-24 LAB — COMPREHENSIVE METABOLIC PANEL
Albumin: 4 g/dL (ref 3.5–5.2)
CO2: 24 mEq/L (ref 19–32)
GFR: 97.98 mL/min (ref >60.00)
Glucose, Bld: 95 mg/dL (ref 70–99)
Potassium: 4.2 mEq/L (ref 3.5–5.1)
Sodium: 141 mEq/L (ref 135–145)
Total Protein: 7.2 g/dL (ref 6.0–8.3)

## 2012-12-24 LAB — CBC WITH DIFFERENTIAL/PLATELET
Basophils Relative: 0.6 % (ref 0.0–3.0)
Eosinophils Absolute: 0.3 10*3/uL (ref 0.0–0.7)
HCT: 46.7 % — ABNORMAL HIGH (ref 36.0–46.0)
Hemoglobin: 15.9 g/dL — ABNORMAL HIGH (ref 12.0–15.0)
Lymphs Abs: 3.7 10*3/uL (ref 0.7–4.0)
MCHC: 33.9 g/dL (ref 30.0–36.0)
Monocytes Absolute: 1 10*3/uL (ref 0.1–1.0)
Monocytes Relative: 7.3 % (ref 3.0–12.0)
Neutro Abs: 8.6 10*3/uL — ABNORMAL HIGH (ref 1.4–7.7)
RBC: 4.81 Mil/uL (ref 3.87–5.11)
RDW: 13.5 % (ref 11.5–14.6)

## 2012-12-24 LAB — LDL CHOLESTEROL, DIRECT: Direct LDL: 161.1 mg/dL

## 2012-12-24 LAB — TSH: TSH: 0.7 u[IU]/mL (ref 0.35–5.50)

## 2012-12-24 LAB — LIPID PANEL
Cholesterol: 246 mg/dL — ABNORMAL HIGH (ref 0–200)
Total CHOL/HDL Ratio: 4

## 2012-12-24 NOTE — Patient Instructions (Addendum)
Good to see you. Please call Dr. Marvell Fuller office( [phone]) to set up your colonoscopy for next month.  We will call you with your lab results.

## 2012-12-24 NOTE — Progress Notes (Signed)
Subjective:    Patient ID: Maureen Warren, female    DOB: January 02, 1956, 57 y.o.   MRN: [id number]  HPI  57 yo G2P2 here for CPX.    Pap smear was normal last August (done by me).   Left breast mass- has been having mammogram/ultrasound every 6 months for 2 years.  Was told after this next mammogram, can go to yearly mammogram. Does have family h/o breast CA. Her sister who is 48 yo was just diagnosed with breast CA.  She has another sister that was diagnosed with breast CA in her 15s. She is unsure if either one was tested for BRCA or estrogen sensitivities.  Anxiety- feels much better.  Off Buspar.  Sister with breast CA was living with them but now that she has recovered and doing well, Ms.  feels much better.  Colon polyps- Colonoscopy done by Dr. Leone Payor last year- 12 polyps max 10 mm - tubular adenomas and sessile serrated polyps Repeat colon 01/2013 Denies any blood in her stool or changes in her bowel habits.   Patient Active Problem List   Diagnosis Date Noted  . Personal history of adenomatous and sessile serrated colonic polyps 01/02/2012  . Abnormal mammogram 12/06/2011  . Routine general medical examination at a health care facility 11/02/2011  . Routine gynecological examination 11/02/2011  . Elevated blood pressure (not hypertension) 11/02/2011  . Adjustment disorder with anxiety 11/02/2011   Past Medical History  Diagnosis Date  . Anxiety   . Personal history of adenomatous and sessile serrated colonic polyps 01/02/2012    12/2011 - 12 polyps max 10 mm sessile serrated and tubular adenomas - repeat colon in 01/2013    Past Surgical History  Procedure Laterality Date  . Cesarean section  1989   History  Substance Use Topics  . Smoking status: Current Every Day Smoker -- 1.00 packs/day    Types: Cigarettes  . Smokeless tobacco: Never Used  . Alcohol Use: 3.6 oz/week    6 Cans of beer per week     Comment: Occasional   Family History  Problem  Relation Age of Onset  . Cancer Sister 73    breast cancer  . Colon cancer Neg Hx   . Stomach cancer Neg Hx   . Rectal cancer Neg Hx   . Colon polyps Neg Hx    No Known Allergies No current outpatient prescriptions on file prior to visit.   No current facility-administered medications on file prior to visit.   The PMH, PSH, Social History, Family History, Medications, and allergies have been reviewed in [facility], and have been updated if relevant.   Review of Systems See HPI Patient reports no  vision/ hearing changes,anorexia, weight change, fever ,adenopathy, persistant / recurrent hoarseness, swallowing issues, chest pain, edema,persistant / recurrent cough, hemoptysis, dyspnea(rest, exertional, paroxysmal nocturnal), gastrointestinal  bleeding (melena, rectal bleeding), abdominal pain, excessive heart burn, GU symptoms(dysuria, hematuria, pyuria, voiding/incontinence  Issues) syncope, focal weakness, severe memory loss, concerning skin lesions, depression, abnormal bruising/bleeding, major joint swelling, breast masses or abnormal vaginal bleeding.  =    Objective:   Physical Exam BP 112/70  Pulse 88  Temp(Src) 97.8 F (36.6 C) (Oral)  Ht 5' 5.75" (1.67 m)  Wt 208 lb 12 oz (94.688 kg)  BMI 33.95 kg/m2  General:  Obese,well-nourished,in no acute distress; alert,appropriate and cooperative throughout examination Head:  normocephalic and atraumatic.   Eyes:  vision grossly intact, pupils equal, pupils round, and pupils reactive to light.  Ears:  R ear normal and L ear normal.   Nose:  no external deformity.   Mouth:  good dentition.   Neck:  No deformities, masses, or tenderness noted. Breasts:  No mass, nodules, thickening, tenderness, bulging, retraction, inflamation, nipple discharge or skin changes noted.   Lungs:  Normal respiratory effort, chest expands symmetrically. Lungs are clear to auscultation, no crackles or wheezes. Heart:  Normal rate and regular rhythm. S1 and S2  normal without gallop, murmur, click, rub or other extra sounds. Abdomen:  Bowel sounds positive,abdomen soft and non-tender without masses, organomegaly or hernias noted. Msk:  No deformity or scoliosis noted of thoracic or lumbar spine.   Extremities:  No clubbing, cyanosis, edema, or deformity noted with normal full range of motion of all joints.   Neurologic:  alert & oriented X3 and gait normal.   Skin:  Intact without suspicious lesions or rashes Cervical Nodes:  No lymphadenopathy noted Axillary Nodes:  No palpable lymphadenopathy Psych:  Cognition and judgment appear intact. Alert and cooperative with normal attention span and concentration. No apparent delusions, illusions, hallucinations     Assessment & Plan:    1. Routine general medical examination at a health care facility Reviewed preventive care protocols, scheduled due services, and updated immunizations Discussed nutrition, exercise, diet, and healthy lifestyle.  - Comprehensive metabolic panel - CBC with Differential - TSH  2. Need for prophylactic vaccination and inoculation against influenza  - Flu Vaccine QUAD 36+ mos PF IM (Fluarix)  3. Leukocytosis, unspecified Discharged from oncology.  Will continue to monitor CBCs.  4. Screening for ischemic heart disease  - Lipid Panel

## 2012-12-26 ENCOUNTER — Encounter: Payer: Self-pay | Admitting: *Deleted

## 2013-01-29 ENCOUNTER — Telehealth: Payer: Self-pay | Admitting: Internal Medicine

## 2013-01-29 NOTE — Telephone Encounter (Signed)
Patient is due for colon now.  She is scheduled for 03/19/13 2:00, she will come for pre-visit 02/04/13

## 2013-02-04 ENCOUNTER — Ambulatory Visit (AMBULATORY_SURGERY_CENTER): Payer: Self-pay | Admitting: *Deleted

## 2013-02-04 VITALS — Ht 67.0 in | Wt 211.4 lb

## 2013-02-04 DIAGNOSIS — Z86010 Personal history of colonic polyps: Secondary | ICD-10-CM

## 2013-02-04 MED ORDER — NA SULFATE-K SULFATE-MG SULF 17.5-3.13-1.6 GM/177ML PO SOLN: 1.0000 | Freq: Once | ORAL | Status: DC

## 2013-02-04 NOTE — Progress Notes (Signed)
No allergies to eggs or soy. No problems with anesthesia.  

## 2013-02-05 ENCOUNTER — Encounter: Payer: Self-pay | Admitting: Internal Medicine

## 2013-03-19 ENCOUNTER — Encounter: Payer: BC Managed Care – PPO | Admitting: Internal Medicine

## 2013-04-09 ENCOUNTER — Encounter: Payer: Self-pay | Admitting: Internal Medicine

## 2013-04-09 ENCOUNTER — Ambulatory Visit (AMBULATORY_SURGERY_CENTER): Payer: BC Managed Care – PPO | Admitting: Internal Medicine

## 2013-04-09 VITALS — BP 115/78 | HR 69 | Temp 97.8°F | Resp 18 | Ht 67.0 in | Wt 211.0 lb

## 2013-04-09 DIAGNOSIS — Z860100 Personal history of colon polyps, unspecified: Secondary | ICD-10-CM

## 2013-04-09 DIAGNOSIS — D129 Benign neoplasm of anus and anal canal: Secondary | ICD-10-CM

## 2013-04-09 DIAGNOSIS — D126 Benign neoplasm of colon, unspecified: Secondary | ICD-10-CM

## 2013-04-09 DIAGNOSIS — D128 Benign neoplasm of rectum: Secondary | ICD-10-CM

## 2013-04-09 DIAGNOSIS — K5730 Diverticulosis of large intestine without perforation or abscess without bleeding: Secondary | ICD-10-CM

## 2013-04-09 MED ORDER — SODIUM CHLORIDE 0.9 % IV SOLN: 500.0000 mL | INTRAVENOUS | Status: DC

## 2013-04-09 NOTE — Progress Notes (Signed)
A/ox3 pleased with MAC, report to Karol RN 

## 2013-04-09 NOTE — Op Note (Signed)
[place]  [address]. [place], [address]   COLONOSCOPY PROCEDURE REPORT  PATIENT: Maureen, Warren  MR#: [id number] BIRTHDATE: 07-20-1955 , 63  yrs. old GENDER: Female ENDOSCOPIST: Gatha Mayer, MD, [facility] PROCEDURE DATE:  04/09/2013 PROCEDURE:   Colonoscopy with snare polypectomy First Screening Colonoscopy - Avg.  risk and is 50 yrs.  old or older - No.  Prior Negative Screening - Now for repeat screening. N/A  History of Adenoma - Now for follow-up colonoscopy & has been > or = to 3 yrs.  No.  It has been less than 3 yrs since last colonoscopy.  Medical reason.  Polyps Removed Today? Yes. ASA CLASS:   Class II INDICATIONS:Patient's personal history of adenomatous colon polyps.  MEDICATIONS: propofol (Diprivan) 400mg  IV, MAC sedation, administered by CRNA, and These medications were titrated to patient response per physician's verbal order  DESCRIPTION OF PROCEDURE:   After the risks benefits and alternatives of the procedure were thoroughly explained, informed consent was obtained.  A digital rectal exam revealed no abnormalities of the rectum.   The LB [id number]-H190 [id number]  endoscope was introduced through the anus and advanced to the cecum, which was identified by both the appendix and ileocecal valve. No adverse events experienced.   The quality of the prep was excellent using Suprep  The instrument was then slowly withdrawn as the colon was fully examined.  COLON FINDINGS: Three sessile polyps measuring 3-12 mm in size were found in the ascending colon and transverse colon.  A polypectomy was performed with a cold snare and using snare cautery.  The resection was complete and the polyp tissue was completely retrieved.   A diminutive sessile polyp was found in the rectum.  A polypectomy was performed with a cold snare.  The resection was complete and the polyp tissue was not retrieved.   Diverticulosis was noted in the sigmoid colon.   The colon mucosa  was otherwise normal.  Retroflexed views revealed no abnormalities. The time to cecum=2 minutes 45 seconds.  Withdrawal time=18 minutes 13 seconds. The scope was withdrawn and the procedure completed. COMPLICATIONS: There were no complications.  ENDOSCOPIC IMPRESSION: 1.   Four sessile polyps measuring 3-12 mm in size were found in the ascending colon and transverse colon; polypectomy was performed with a cold snare and using snare cautery 2.   Diminutive sessile polyp was found in the rectum; polypectomy was performed with a cold snare 3.   Diverticulosis was noted in the sigmoid colon 4.   The colon mucosa was otherwise normal - excellent prep - hx 12 polyps - tubular adenomas and sessile serrated adenomas  RECOMMENDATIONS: 1.  Timing of repeat colonoscopy will be determined by pathology findings. 2.  Hold aspirin, aspirin products, and anti-inflammatory medication for 2 weeks.  eSigned:  Gatha Mayer, MD, [facility] 04/09/2013 3:42 PM   cc: The Patient

## 2013-04-09 NOTE — Patient Instructions (Addendum)
I found and removed 4 polyps today. They all look benign.  I will let you know pathology results and when to have another routine colonoscopy by mail.  I appreciate the opportunity to care for you. Gatha Mayer, MD, [facility]  Polyp instruction sheet given to patient.  YOU HAD AN ENDOSCOPIC PROCEDURE TODAY AT [facility]: Refer to the procedure report that was given to you for any specific questions about what was found during the examination.  If the procedure report does not answer your questions, please call your gastroenterologist to clarify.  If you requested that your care partner not be given the details of your procedure findings, then the procedure report has been included in a sealed envelope for you to review at your convenience later.  YOU SHOULD EXPECT: Some feelings of bloating in the abdomen. Passage of more gas than usual.  Walking can help get rid of the air that was put into your GI tract during the procedure and reduce the bloating. If you had a lower endoscopy (such as a colonoscopy or flexible sigmoidoscopy) you may notice spotting of blood in your stool or on the toilet paper. If you underwent a bowel prep for your procedure, then you may not have a normal bowel movement for a few days.  DIET: Your first meal following the procedure should be a light meal and then it is ok to progress to your normal diet.  A half-sandwich or bowl of soup is an example of a good first meal.  Heavy or fried foods are harder to digest and may make you feel nauseous or bloated.  Likewise meals heavy in dairy and vegetables can cause extra gas to form and this can also increase the bloating.  Drink plenty of fluids but you should avoid alcoholic beverages for 24 hours.  ACTIVITY: Your care partner should take you home directly after the procedure.  You should plan to take it easy, moving slowly for the rest of the day.  You can resume normal activity the day after the procedure however  you should NOT DRIVE or use heavy machinery for 24 hours (because of the sedation medicines used during the test).    SYMPTOMS TO REPORT IMMEDIATELY: A gastroenterologist can be reached at any hour.  During normal business hours, 8:30 AM to 5:00 PM Monday through Friday, call [phone].  After hours and on weekends, please call the GI answering service at [phone] who will take a message and have the physician on call contact you.   Following lower endoscopy (colonoscopy or flexible sigmoidoscopy):  Excessive amounts of blood in the stool  Significant tenderness or worsening of abdominal pains  Swelling of the abdomen that is new, acute  Fever of 100F or higher  FOLLOW UP: If any biopsies were taken you will be contacted by phone or by letter within the next 1-3 weeks.  Call your gastroenterologist if you have not heard about the biopsies in 3 weeks.  Our staff will call the home number listed on your records the next business day following your procedure to check on you and address any questions or concerns that you may have at that time regarding the information given to you following your procedure. This is a courtesy call and so if there is no answer at the home number and we have not heard from you through the emergency physician on call, we will assume that you have returned to your regular daily activities without incident.  SIGNATURES/CONFIDENTIALITY: You and/or your care partner have signed paperwork which will be entered into your electronic medical record.  These signatures attest to the fact that that the information above on your After Visit Summary has been reviewed and is understood.  Full responsibility of the confidentiality of this discharge information lies with you and/or your care-partner.

## 2013-04-09 NOTE — Progress Notes (Signed)
Called to room to assist during endoscopic procedure.  Patient ID and intended procedure confirmed with present staff. Received instructions for my participation in the procedure from the performing physician.  

## 2013-04-10 ENCOUNTER — Telehealth: Payer: Self-pay | Admitting: *Deleted

## 2013-04-10 NOTE — Telephone Encounter (Signed)
  Follow up Call-  Call back number 04/09/2013 12/27/2011  Post procedure Call Back phone  # [phone]  Permission to leave phone message Yes Yes     Patient questions:  Do you have a fever, pain , or abdominal swelling? no Pain Score  0 *  Have you tolerated food without any problems? yes  Have you been able to return to your normal activities? yes  Do you have any questions about your discharge instructions: Diet   no Medications  no Follow up visit  no  Do you have questions or concerns about your Care? no  Actions: * If pain score is 4 or above: No action needed, pain <4.

## 2013-04-14 ENCOUNTER — Encounter: Payer: Self-pay | Admitting: Internal Medicine

## 2013-04-14 NOTE — Progress Notes (Signed)
Quick Note:  4 polyps - tubular and sessile serrated adenomas max 12 mm - repeat colon 2018 ______

## 2013-06-13 ENCOUNTER — Other Ambulatory Visit: Payer: Self-pay | Admitting: Family Medicine

## 2013-06-13 DIAGNOSIS — N63 Unspecified lump in unspecified breast: Secondary | ICD-10-CM

## 2013-06-26 ENCOUNTER — Ambulatory Visit
Admission: RE | Admit: 2013-06-26 | Discharge: 2013-06-26 | Disposition: A | Discharge status: Home / Self Care | Payer: Self-pay | Source: Ambulatory Visit | Attending: Family Medicine | Admitting: Family Medicine

## 2013-06-26 DIAGNOSIS — N63 Unspecified lump in unspecified breast: Secondary | ICD-10-CM

## 2013-11-13 ENCOUNTER — Other Ambulatory Visit: Payer: Self-pay | Admitting: Family Medicine

## 2013-11-13 DIAGNOSIS — N63 Unspecified lump in unspecified breast: Secondary | ICD-10-CM

## 2013-11-19 ENCOUNTER — Ambulatory Visit
Admission: RE | Admit: 2013-11-19 | Discharge: 2013-11-19 | Disposition: A | Discharge status: Home / Self Care | Payer: BC Managed Care – PPO | Source: Ambulatory Visit | Attending: Family Medicine | Admitting: Family Medicine

## 2013-11-19 ENCOUNTER — Encounter (INDEPENDENT_AMBULATORY_CARE_PROVIDER_SITE_OTHER): Payer: Self-pay

## 2013-11-19 DIAGNOSIS — N63 Unspecified lump in unspecified breast: Secondary | ICD-10-CM

## 2014-03-05 IMAGING — CR DG CHEST 2V
2 series · 2 of 2 positions shown · non-contrast
Comparison: None.

CLINICAL DATA: Cough

CHEST - 2 VIEW

[w chest pa]
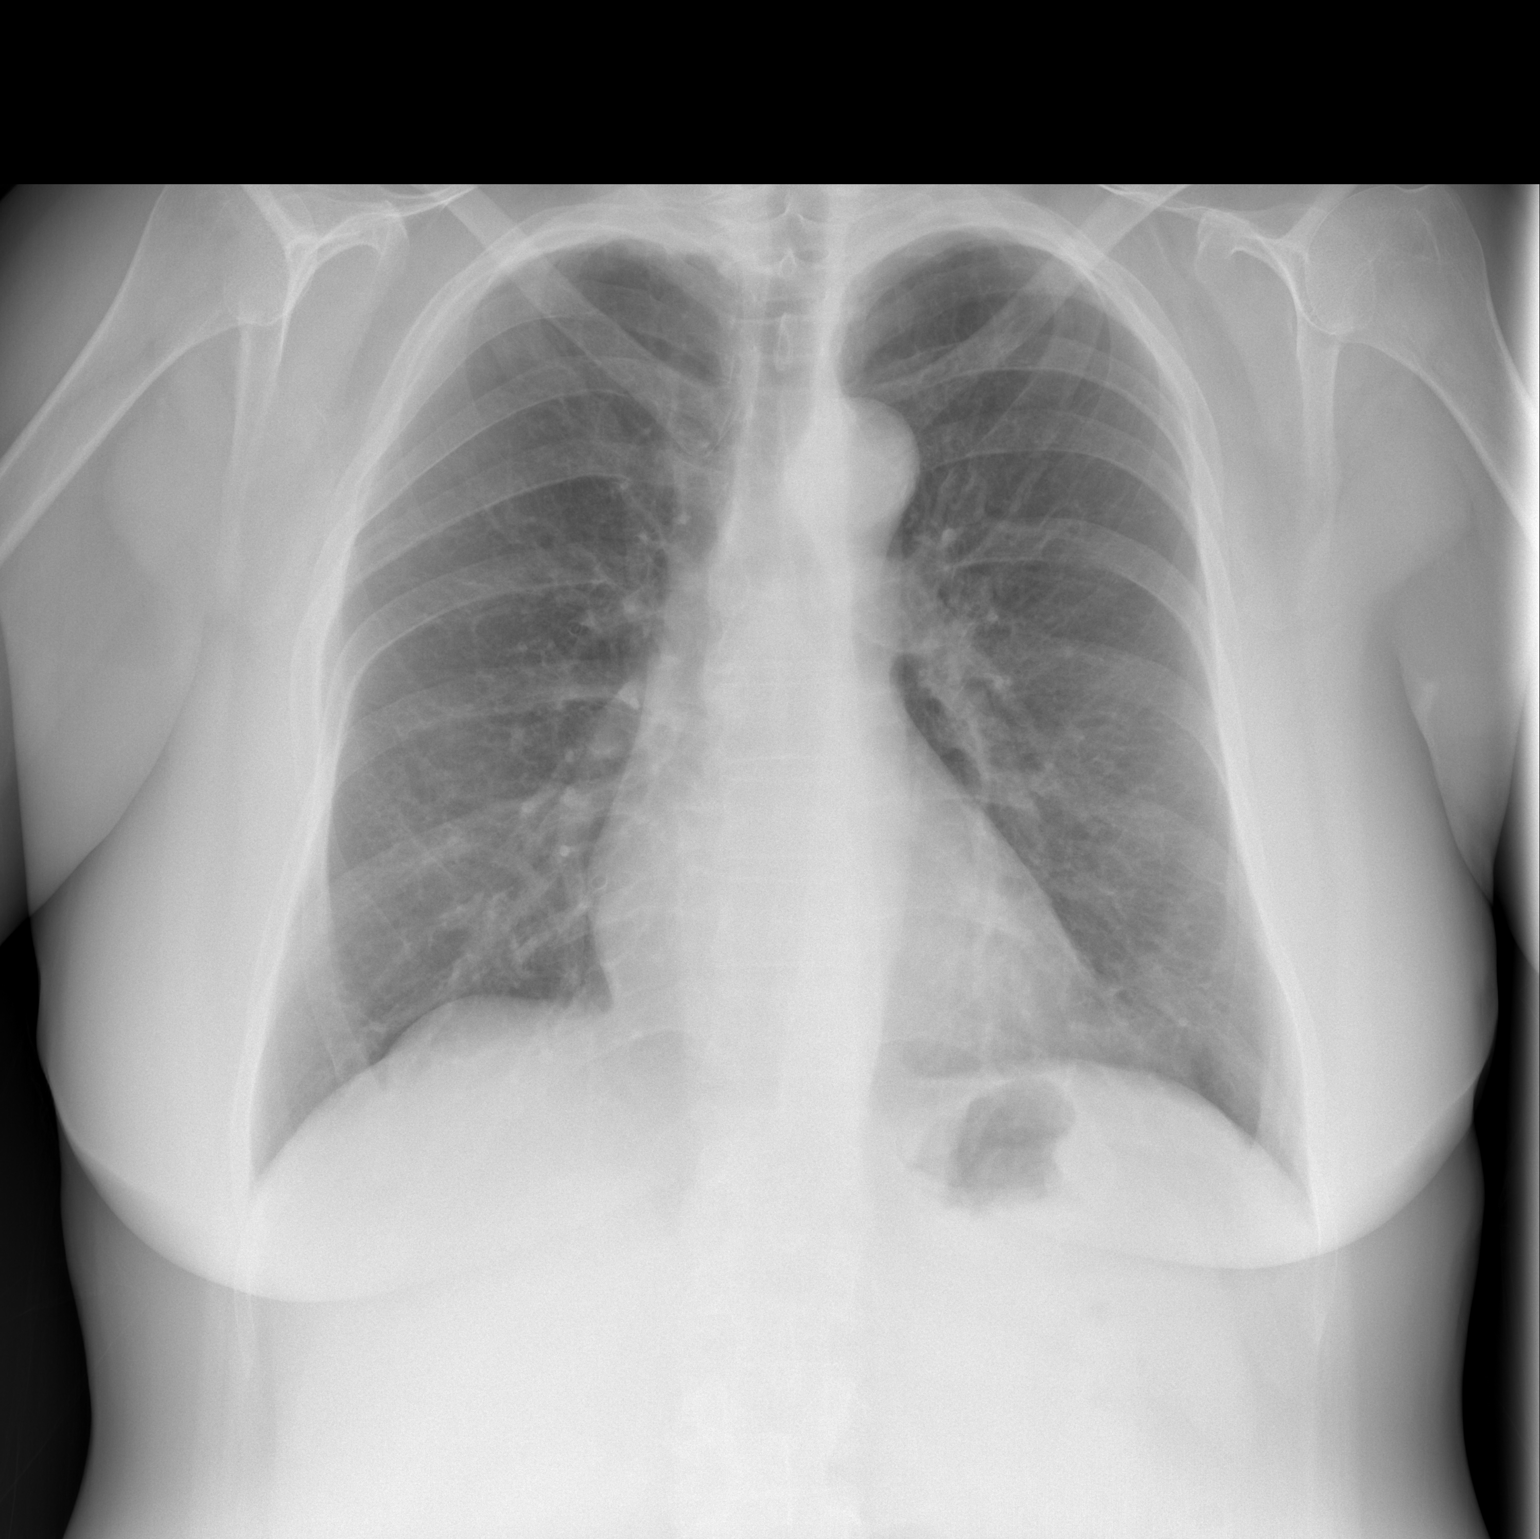

[w chest lat]
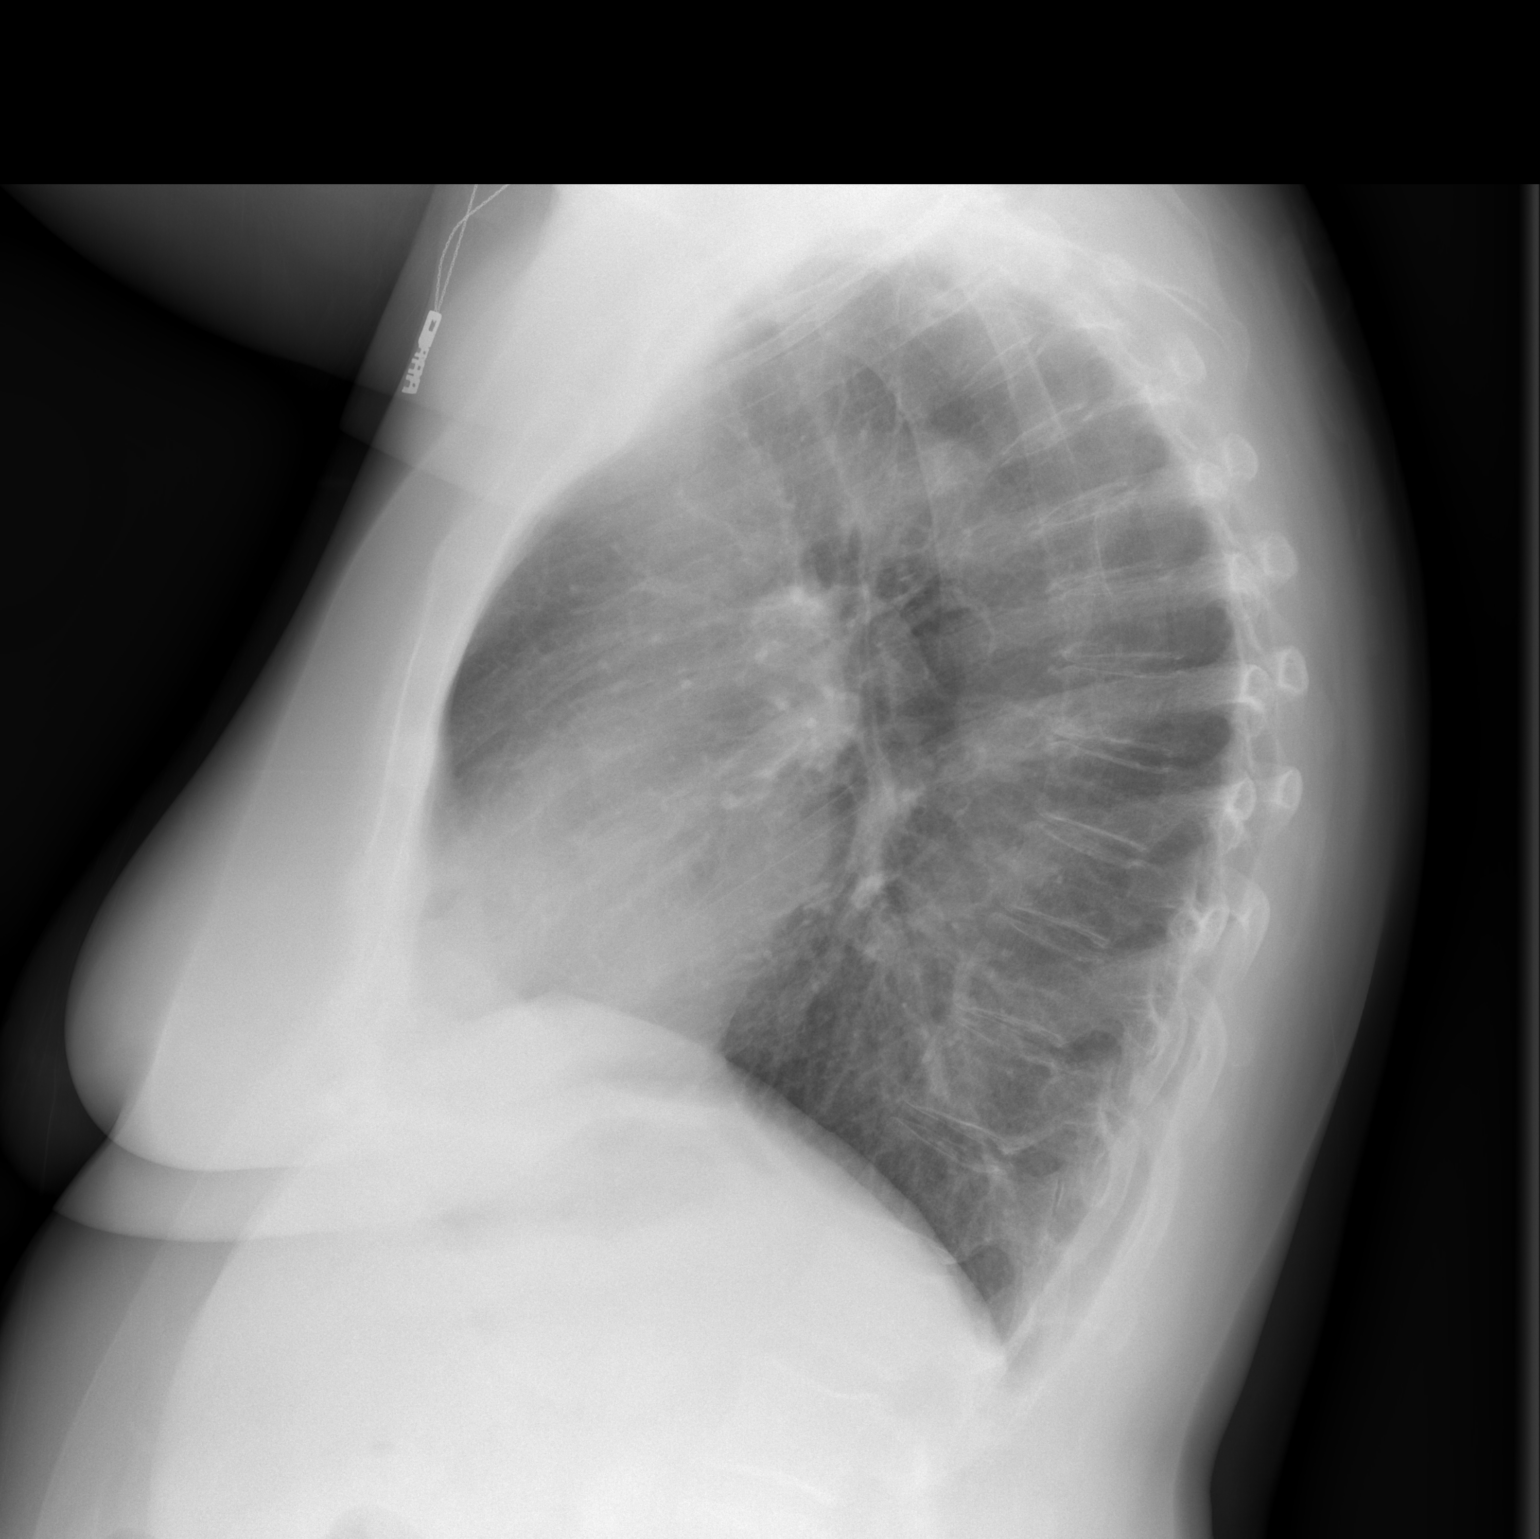

[2 of 2 positions shown; findings below may reference images not displayed]

FINDINGS: Lungs are clear.  Heart size and pulmonary vascularity
are normal.  No adenopathy.  No bone lesions.
IMPRESSION: Lungs clear.

## 2016-04-17 ENCOUNTER — Encounter: Payer: Self-pay | Admitting: Internal Medicine

## 2017-02-15 ENCOUNTER — Encounter: Payer: Self-pay | Admitting: Internal Medicine

## 2018-03-08 ENCOUNTER — Encounter: Payer: Self-pay | Admitting: Internal Medicine

## 2018-03-21 ENCOUNTER — Encounter: Payer: Self-pay | Admitting: Internal Medicine

## 2018-03-21 ENCOUNTER — Ambulatory Visit (AMBULATORY_SURGERY_CENTER): Payer: Self-pay | Admitting: *Deleted

## 2018-03-21 ENCOUNTER — Other Ambulatory Visit: Payer: Self-pay

## 2018-03-21 VITALS — Ht 67.0 in | Wt 222.4 lb

## 2018-03-21 DIAGNOSIS — Z860100 Personal history of colon polyps, unspecified: Secondary | ICD-10-CM

## 2018-03-21 NOTE — Progress Notes (Signed)
No egg or soy allergy known to patient  No issues with past sedation with any surgeries  or procedures, no intubation problems  No diet pills per patient No home 02 use per patient  No blood thinners per patient  Pt denies issues with constipation  No A fib or A flutter  EMMI video  Offered and declined by the patient. 

## 2018-04-04 ENCOUNTER — Encounter: Payer: Self-pay | Admitting: Internal Medicine

## 2018-04-04 ENCOUNTER — Encounter: Payer: Self-pay | Admitting: Emergency Medicine

## 2018-04-04 ENCOUNTER — Ambulatory Visit
Admission: EM | Admit: 2018-04-04 | Discharge: 2018-04-04 | Disposition: A | Discharge status: Home / Self Care | Payer: Managed Care, Other (non HMO) | Attending: Physician Assistant | Admitting: Physician Assistant

## 2018-04-04 DIAGNOSIS — I10 Essential (primary) hypertension: Secondary | ICD-10-CM | POA: Insufficient documentation

## 2018-04-04 MED ORDER — HYDROCHLOROTHIAZIDE 25 MG PO TABS: 12.5000 mg | ORAL_TABLET | Freq: Every day | ORAL | 1 refills | Status: AC

## 2018-04-04 NOTE — ED Provider Notes (Signed)
[facility]    CSN: [id number] Arrival date & time: 04/04/18  1114     History   Chief Complaint Chief Complaint  Patient presents with  . Hypertension    HPI Maureen Warren is a 63 y.o. female.   Pt here to get blood pressure checked.  Pt was suppose to have a colonoscopy today but blood pressure was elevated.  Pt admits to increased stress.   The history is provided by the patient. No language interpreter was used.  Hypertension  This is a new problem. The current episode started 6 to 12 hours ago. The problem occurs constantly. The problem has been gradually worsening. Nothing aggravates the symptoms. Nothing relieves the symptoms. She has tried nothing for the symptoms.    Past Medical History:  Diagnosis Date  . Anxiety   . 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 Personal history of adenomatous and sessile serrated colonic polyps 01/02/2012   12/2011 - 12 polyps max 10 mm sessile serrated and tubular adenomas - repeat colon in 01/2013     Patient Active Problem List   Diagnosis Date Noted  . Leukocytosis, unspecified 12/24/2012  . Personal history of adenomatous and sessile serrated colonic polyps 01/02/2012  . Abnormal mammogram 12/06/2011  . Routine general medical examination at a health care facility 11/02/2011  . Routine gynecological examination 11/02/2011  . Elevated blood pressure (not hypertension) 11/02/2011  . Adjustment disorder with anxiety 11/02/2011    Past Surgical History:  Procedure Laterality Date  . CESAREAN SECTION  1989  . COLONOSCOPY    . POLYPECTOMY      OB History   No obstetric history on file.      Home Medications    Prior to Admission medications   Medication Sig Start Date End Date Taking? Authorizing Provider  hydrochlorothiazide (HYDRODIURIL) 25 MG tablet Take 0.5 tablets (12.5 mg total) by mouth daily. 04/04/18   Fransico Meadow, PA-C    Family History Family History  Problem Relation Age of Onset    . Cancer Sister 29       breast cancer  . Colon cancer Neg Hx   . Stomach cancer Neg Hx   . Rectal cancer Neg Hx   . Colon polyps Neg Hx   . Esophageal cancer Neg Hx     Social History Social History   Tobacco Use  . Smoking status: Current Every Day Smoker    Packs/day: 1.00    Types: Cigarettes  . Smokeless tobacco: Never Used  Substance Use Topics  . Alcohol use: Yes    Alcohol/week: 6.0 standard drinks    Types: 6 Cans of beer per week  . Drug use: No     Allergies   Patient has no known allergies.   Review of Systems Review of Systems  All other systems reviewed and are negative.    Physical Exam Triage Vital Signs ED Triage Vitals [04/04/18 1123]  Enc Vitals Group     BP (!) 168/121     Pulse Rate (!) 117     Resp      Temp 98 F (36.7 C)     Temp src      SpO2 92 %     Weight      Height      Head Circumference      Peak Flow      Pain Score      Pain Loc      Pain Edu?  Excl. in [place]?    No data found.  Updated Vital Signs BP (!) 152/103 (BP Location: Left Arm)   Pulse (!) 117   Temp 98 F (36.7 C)   SpO2 92%   Visual Acuity Right Eye Distance:   Left Eye Distance:   Bilateral Distance:    Right Eye Near:   Left Eye Near:    Bilateral Near:     Physical Exam Vitals signs and nursing note reviewed.  Constitutional:      Appearance: She is well-developed.  HENT:     Head: Normocephalic.     Mouth/Throat:     Mouth: Mucous membranes are moist.  Neck:     Musculoskeletal: Normal range of motion.  Cardiovascular:     Rate and Rhythm: Tachycardia present.  Pulmonary:     Effort: Pulmonary effort is normal.  Abdominal:     General: There is no distension.  Musculoskeletal: Normal range of motion.  Skin:    General: Skin is warm.  Neurological:     Mental Status: She is alert and oriented to person, place, and time.  Psychiatric:        Mood and Affect: Mood normal.      UC Treatments / Results  Labs (all labs  ordered are listed, but only abnormal results are displayed) Labs Reviewed - No data to display  EKG None  Radiology No results found.  Procedures Procedures (including critical care time)  Medications Ordered in UC Medications - No data to display  Initial Impression / Assessment and Plan / UC Course  I have reviewed the triage vital signs and the nursing notes.  Pertinent labs & imaging results that were available during my care of the patient were reviewed by me and considered in my medical decision making (see chart for details).     MDM  Pt asked to relax, lights off,  Heart rate normalized,  Bp decreased to 152/103   I will start pt on low dose hctz.  Pt is advised to schedule to see her MD for recheck next week.  Final Clinical Impressions(s) / UC Diagnoses   Final diagnoses:  Essential hypertension   Discharge Instructions   None    ED Prescriptions    Medication Sig Dispense Auth. Provider   hydrochlorothiazide (HYDRODIURIL) 25 MG tablet Take 0.5 tablets (12.5 mg total) by mouth daily. 30 tablet Fransico Meadow, [place]     Controlled Substance Prescriptions  Controlled Substance Registry consulted? No   Fransico Meadow, [place] 04/04/18 1351

## 2018-04-04 NOTE — ED Notes (Signed)
Patient able to ambulate independently  

## 2018-04-04 NOTE — ED Triage Notes (Signed)
Pt presents to [facility] for assessment after taking her BP this morning and coming up with 190/110.  States she is scheduled for a colonoscopy today, has been NPO since early yesterday.  C/o headache overnight, denies pain at this time.  No focal neuro deficits noted in triage.

## 2019-08-01 ENCOUNTER — Ambulatory Visit: Payer: Managed Care, Other (non HMO) | Attending: Internal Medicine

## 2019-08-01 DIAGNOSIS — Z23 Encounter for immunization: Secondary | ICD-10-CM

## 2019-08-01 NOTE — Progress Notes (Signed)
   Covid-19 Vaccination Clinic  Name:  Maureen Warren    MRN: [id number] DOB: [birth year]  08/01/2019  [name] was observed post Covid-19 immunization for 15 minutes without incident. She was provided with Vaccine Information Sheet and instruction to access the V-Safe system.   [name] was instructed to call 911 with any severe reactions post vaccine: Marland Kitchen Difficulty breathing  . Swelling of face and throat  . A fast heartbeat  . A bad rash all over body  . Dizziness and weakness   Immunizations Administered    Name Date Dose VIS Date Route   Pfizer COVID-19 Vaccine 08/01/2019 10:16 AM 0.3 mL 05/28/2018 Intramuscular   Manufacturer: [place]   Lot: [id number]   [place]: [id number]

## 2019-09-09 ENCOUNTER — Ambulatory Visit: Payer: Managed Care, Other (non HMO) | Attending: Internal Medicine

## 2019-09-09 DIAGNOSIS — Z23 Encounter for immunization: Secondary | ICD-10-CM

## 2019-09-09 NOTE — Progress Notes (Signed)
   Covid-19 Vaccination Clinic  Name:  Maureen Warren    MRN: [id number] DOB: 05/19/1955  09/09/2019  [name] was observed post Covid-19 immunization for 15 minutes without incident. She was provided with Vaccine Information Sheet and instruction to access the V-Safe system.   [name] was instructed to call 911 with any severe reactions post vaccine: Marland Kitchen Difficulty breathing  . Swelling of face and throat  . A fast heartbeat  . A bad rash all over body  . Dizziness and weakness   Immunizations Administered    Name Date Dose VIS Date Route   Pfizer COVID-19 Vaccine 09/09/2019 10:47 AM 0.3 mL 05/28/2018 Intramuscular   Manufacturer: [place]   Lot: [id number]   [place]: [id number]
